# Patient Record
Sex: Male | Born: 2000 | Race: White | Hispanic: No | Marital: Single | Smoking: Never smoker
Health system: Southern US, Community
[De-identification: ages and names within clinical notes are randomized; demographics above are authoritative.]

---
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
## 2019-05-25 ENCOUNTER — Emergency Department (HOSPITAL_COMMUNITY)
Admission: EM | Admit: 2019-05-25 | Discharge: 2019-05-25 | Disposition: A | Discharge status: Home / Self Care | Payer: Self-pay | Attending: Emergency Medicine | Admitting: Emergency Medicine

## 2019-05-25 ENCOUNTER — Other Ambulatory Visit: Payer: Self-pay

## 2019-05-25 ENCOUNTER — Encounter (HOSPITAL_COMMUNITY): Payer: Self-pay

## 2019-05-25 DIAGNOSIS — L02213 Cutaneous abscess of chest wall: Secondary | ICD-10-CM | POA: Insufficient documentation

## 2019-05-25 DIAGNOSIS — L0291 Cutaneous abscess, unspecified: Secondary | ICD-10-CM

## 2019-05-25 MED ORDER — LIDOCAINE-EPINEPHRINE 2 %-1:100000 IJ SOLN: 10.0000 mL | Freq: Once | INTRAMUSCULAR | Status: DC

## 2019-05-25 MED ORDER — DOXYCYCLINE HYCLATE 100 MG PO CAPS: 100.0000 mg | ORAL_CAPSULE | Freq: Two times a day (BID) | ORAL | 0 refills | Status: DC

## 2019-05-25 MED ORDER — LIDOCAINE-EPINEPHRINE 2 %-1:100000 IJ SOLN: 20.0000 mL | Freq: Once | INTRAMUSCULAR | Status: DC

## 2019-05-25 NOTE — ED Triage Notes (Signed)
Patient reports that he has an absces? Or black head? On his mid chest x 4 days.

## 2019-05-25 NOTE — ED Provider Notes (Signed)
[facility] DEPT Provider Note   CSN: [id number] Arrival date & time: 05/25/19  1836    History   Chief Complaint Chief Complaint  Patient presents with  . Abscess    HPI Stephen Herring is a 18 y.o. male.     52 yo M with a chief complaints of a chest wall abscess.  The patient noticed a bump on his chest and then shaved his chest to try and see what it was.  Since then it has gotten slightly worse.  Going on for the past couple days.  No fevers chills no nausea.  Has had blemishes on his chest before.  The history is provided by the patient.  Abscess  Location:  Torso Torso abscess location: chest wall. Abscess quality: induration, itching and painful   Red streaking: no   Pain details:    Quality:  No pain   Severity:  Mild   Duration:  2 days   Timing:  Constant   Progression:  Worsening Chronicity:  Recurrent Context: not diabetes and not immunosuppression   Relieved by:  Nothing Worsened by:  Nothing Ineffective treatments:  None tried Associated symptoms: no fever, no headaches and no vomiting     History reviewed. No pertinent past medical history.  There are no active problems to display for this patient.   History reviewed. No pertinent surgical history.      Home Medications    Prior to Admission medications   Medication Sig Start Date End Date Taking? Authorizing Provider  doxycycline (VIBRAMYCIN) 100 MG capsule Take 1 capsule (100 mg total) by mouth 2 (two) times daily. One po bid x 7 days 05/25/19   Deno Etienne, DO    Family History History reviewed. No pertinent family history.  Social History Social History   Tobacco Use  . Smoking status: Never Smoker  . Smokeless tobacco: Never Used  Substance Use Topics  . Alcohol use: Never    Frequency: Never  . Drug use: Never     Allergies   Patient has no known allergies.   Review of Systems Review of Systems  Constitutional: Negative for chills and  fever.  HENT: Negative for congestion and facial swelling.   Eyes: Negative for discharge and visual disturbance.  Respiratory: Negative for shortness of breath.   Cardiovascular: Negative for chest pain and palpitations.  Gastrointestinal: Negative for abdominal pain, diarrhea and vomiting.  Musculoskeletal: Negative for arthralgias and myalgias.  Skin: Positive for color change and wound. Negative for rash.  Neurological: Negative for tremors, syncope and headaches.  Psychiatric/Behavioral: Negative for confusion and dysphoric mood.     Physical Exam Updated Vital Signs BP 126/70 (BP Location: Right Arm)   Pulse 87   Temp 99 F (37.2 C) (Oral)   Resp 16   Ht 6' (1.829 m)   Wt 65.8 kg   SpO2 100%   BMI 19.67 kg/m   Physical Exam Vitals signs and nursing note reviewed.  Constitutional:      Appearance: He is well-developed.  HENT:     Head: Normocephalic and atraumatic.  Eyes:     Pupils: Pupils are equal, round, and reactive to light.  Neck:     Musculoskeletal: Normal range of motion and neck supple.     Vascular: No JVD.  Cardiovascular:     Rate and Rhythm: Normal rate and regular rhythm.     Heart sounds: No murmur. No friction rub. No gallop.      Comments: Confluent  appearing abscess to the anterior chest wall over the sternum. Pulmonary:     Effort: No respiratory distress.     Breath sounds: No wheezing.  Abdominal:     General: There is no distension.     Tenderness: There is no guarding or rebound.  Musculoskeletal: Normal range of motion.  Skin:    Coloration: Skin is not pale.     Findings: No rash.  Neurological:     Mental Status: He is alert and oriented to person, place, and time.  Psychiatric:        Behavior: Behavior normal.      ED Treatments / Results  Labs (all labs ordered are listed, but only abnormal results are displayed) Labs Reviewed - No data to display  EKG None  Radiology No results found.  Procedures .Marland Kitchen.Incision  and Drainage Date/Time: 05/25/2019 7:43 PM Performed by: Melene PlanFloyd, Wilmont Olund, DO Authorized by: Melene PlanFloyd, Lacoya Wilbanks, DO   Consent:    Consent obtained:  Verbal   Consent given by:  Patient   Risks discussed:  Bleeding, incomplete drainage and infection   Alternatives discussed:  No treatment, delayed treatment and alternative treatment Location:    Type:  Abscess   Location:  Trunk   Trunk location:  Chest Pre-procedure details:    Skin preparation:  Chloraprep Anesthesia (see MAR for exact dosages):    Anesthesia method:  Local infiltration   Local anesthetic:  Lidocaine 2% WITH epi Procedure type:    Complexity:  Complex Procedure details:    Needle aspiration: no     Incision types:  Single straight   Incision depth:  Subcutaneous   Scalpel blade:  11   Wound management:  Probed and deloculated   Drainage:  Bloody   Drainage amount:  Moderate   Wound treatment:  Wound left open   Packing materials:  None Post-procedure details:    Patient tolerance of procedure:  Tolerated well, no immediate complications   (including critical care time)  Medications Ordered in ED Medications  lidocaine-EPINEPHrine (XYLOCAINE W/EPI) 2 %-1:100000 (with pres) injection 10 mL (has no administration in time range)     Initial Impression / Assessment and Plan / ED Course  I have reviewed the triage vital signs and the nursing notes.  Pertinent labs & imaging results that were available during my care of the patient were reviewed by me and considered in my medical decision making (see chart for details).        18 yo M with a chest wall abscess.  I&D at bedside.  Started on a course of doxycycline.  PCP follow-up.  7:43 PM:  I have discussed the diagnosis/risks/treatment options with the patient and believe the pt to be eligible for discharge home to follow-up with PCP. We also discussed returning to the ED immediately if new or worsening sx occur. We discussed the sx which are most concerning (e.g.,  sudden worsening pain, fever, inability to tolerate by mouth) that necessitate immediate return. Medications administered to the patient during their visit and any new prescriptions provided to the patient are listed below.  Medications given during this visit Medications  lidocaine-EPINEPHrine (XYLOCAINE W/EPI) 2 %-1:100000 (with pres) injection 10 mL (has no administration in time range)     The patient appears reasonably screen and/or stabilized for discharge and I doubt any other medical condition or other [facility] requiring further screening, evaluation, or treatment in the ED at this time prior to discharge.    Final Clinical Impressions(s) / ED Diagnoses  Final diagnoses:  Abscess    ED Discharge Orders         Ordered    doxycycline (VIBRAMYCIN) 100 MG capsule  2 times daily     05/25/19 1938           Melene PlanFloyd, Rey Fors, DO 05/25/19 1943

## 2019-05-25 NOTE — Discharge Instructions (Signed)
Warm compresses 4 times a day.  Return for rapid spreading redness fever.

## 2022-01-26 ENCOUNTER — Other Ambulatory Visit: Payer: Self-pay

## 2022-01-26 ENCOUNTER — Emergency Department (HOSPITAL_COMMUNITY): Payer: Self-pay

## 2022-01-26 ENCOUNTER — Encounter (HOSPITAL_COMMUNITY): Payer: Self-pay | Admitting: Emergency Medicine

## 2022-01-26 ENCOUNTER — Emergency Department (HOSPITAL_COMMUNITY)
Admission: EM | Admit: 2022-01-26 | Discharge: 2022-01-26 | Disposition: A | Discharge status: Home / Self Care | Payer: Self-pay | Attending: Emergency Medicine | Admitting: Emergency Medicine

## 2022-01-26 DIAGNOSIS — R002 Palpitations: Secondary | ICD-10-CM

## 2022-01-26 DIAGNOSIS — Y990 Civilian activity done for income or pay: Secondary | ICD-10-CM | POA: Insufficient documentation

## 2022-01-26 DIAGNOSIS — R000 Tachycardia, unspecified: Secondary | ICD-10-CM | POA: Insufficient documentation

## 2022-01-26 DIAGNOSIS — F1590 Other stimulant use, unspecified, uncomplicated: Secondary | ICD-10-CM | POA: Insufficient documentation

## 2022-01-26 DIAGNOSIS — F419 Anxiety disorder, unspecified: Secondary | ICD-10-CM | POA: Insufficient documentation

## 2022-01-26 DIAGNOSIS — Z79899 Other long term (current) drug therapy: Secondary | ICD-10-CM | POA: Insufficient documentation

## 2022-01-26 LAB — HEPATIC FUNCTION PANEL
ALT: 19 U/L (ref 0–44)
AST: 21 U/L (ref 15–41)
Albumin: 4.3 g/dL (ref 3.5–5.0)
Alkaline Phosphatase: 68 U/L (ref 38–126)
Bilirubin, Direct: 0.1 mg/dL (ref 0.0–0.2)
Indirect Bilirubin: 0.5 mg/dL (ref 0.3–0.9)
Total Bilirubin: 0.6 mg/dL (ref 0.3–1.2)
Total Protein: 7.6 g/dL (ref 6.5–8.1)

## 2022-01-26 LAB — URINALYSIS, ROUTINE W REFLEX MICROSCOPIC
Bilirubin Urine: NEGATIVE
Glucose, UA: NEGATIVE mg/dL
Hgb urine dipstick: NEGATIVE
Ketones, ur: NEGATIVE mg/dL
Leukocytes,Ua: NEGATIVE
Nitrite: NEGATIVE
Protein, ur: NEGATIVE mg/dL
Specific Gravity, Urine: 1.026 (ref 1.005–1.030)
pH: 6 (ref 5.0–8.0)

## 2022-01-26 LAB — BASIC METABOLIC PANEL
Anion gap: 7 (ref 5–15)
BUN: 26 mg/dL — ABNORMAL HIGH (ref 6–20)
CO2: 31 mmol/L (ref 22–32)
Calcium: 8.8 mg/dL — ABNORMAL LOW (ref 8.9–10.3)
Chloride: 101 mmol/L (ref 98–111)
Creatinine, Ser: 0.79 mg/dL (ref 0.61–1.24)
GFR, Estimated: >60 mL/min (ref >60)
Glucose, Bld: 91 mg/dL (ref 70–99)
Potassium: 3.3 mmol/L — ABNORMAL LOW (ref 3.5–5.1)
Sodium: 139 mmol/L (ref 135–145)

## 2022-01-26 LAB — CBC
HCT: 48.5 % (ref 39.0–52.0)
Hemoglobin: 17.7 g/dL — ABNORMAL HIGH (ref 13.0–17.0)
MCH: 31.3 pg (ref 26.0–34.0)
MCHC: 36.5 g/dL — ABNORMAL HIGH (ref 30.0–36.0)
MCV: 85.7 fL (ref 80.0–100.0)
Platelets: 198 10*3/uL (ref 150–400)
RBC: 5.66 MIL/uL (ref 4.22–5.81)
RDW: 12.4 % (ref 11.5–15.5)
WBC: 8.7 10*3/uL (ref 4.0–10.5)
nRBC: 0 % (ref 0.0–0.2)

## 2022-01-26 LAB — RAPID URINE DRUG SCREEN, HOSP PERFORMED
Amphetamines: POSITIVE — AB
Barbiturates: NOT DETECTED
Benzodiazepines: NOT DETECTED
Cocaine: NOT DETECTED
Opiates: NOT DETECTED
Tetrahydrocannabinol: NOT DETECTED

## 2022-01-26 LAB — MAGNESIUM: Magnesium: 2.5 mg/dL — ABNORMAL HIGH (ref 1.7–2.4)

## 2022-01-26 LAB — TSH: TSH: 1.73 u[IU]/mL (ref 0.350–4.500)

## 2022-01-26 LAB — ETHANOL: Alcohol, Ethyl (B): <10 mg/dL (ref <10)

## 2022-01-26 MED ORDER — SODIUM CHLORIDE 0.9 % IV BOLUS
1000.0000 mL | Freq: Once | INTRAVENOUS | Status: AC
  Administered 2022-01-26: 1000 mL via INTRAVENOUS

## 2022-01-26 NOTE — ED Triage Notes (Signed)
Patient c/o anxiety worsening for months. States he is going to get evicted from his house, lost his wallet, and used drugs x1 week ago.

## 2022-01-26 NOTE — ED Provider Notes (Signed)
[facility]-EMERGENCY DEPT Provider Note   CSN: [id number] Arrival date & time: 01/26/22  0702     History  Chief Complaint  Patient presents with   Anxiety    Stephen Herring is a 21 y.o. male.  Pt is a 21 yo wm with no past medical hx.  Pt said he feels like he has anxiety.  The anxiety has been worsening for a few months.  He has been evicted from his house.  He can't sleep.  He wants to see a therapist, but does not know how to get one.  He feels like his heart is going too fast and then he can't breathe.  He said last night was the worst, so he came in this am.  He feels depressed and has had some brief suicidal thoughts, but has people whom he cares for and who cares about him, so he does not seriously think of suicide.        Home Medications Prior to Admission medications   Medication Sig Start Date End Date Taking? Authorizing Provider  doxycycline (VIBRAMYCIN) 100 MG capsule Take 1 capsule (100 mg total) by mouth 2 (two) times daily. One po bid x 7 days 05/25/19   Melene Plan, DO      Allergies    Patient has no known allergies.    Review of Systems   Review of Systems  Cardiovascular:  Positive for palpitations.  Psychiatric/Behavioral:  Positive for dysphoric mood, sleep disturbance and suicidal ideas. The patient is nervous/anxious.   All other systems reviewed and are negative.  Physical Exam Updated Vital Signs BP 131/73    Pulse 100    Temp 98.4 F (36.9 C) (Oral)    Resp 18    SpO2 100%  Physical Exam Vitals and nursing note reviewed.  Constitutional:      Appearance: Normal appearance.  HENT:     Head: Normocephalic.     Right Ear: External ear normal.     Left Ear: External ear normal.     Nose: Nose normal.     Mouth/Throat:     Mouth: Mucous membranes are dry.  Eyes:     Extraocular Movements: Extraocular movements intact.     Conjunctiva/sclera: Conjunctivae normal.     Pupils: Pupils are equal, round, and reactive to  light.  Cardiovascular:     Rate and Rhythm: Regular rhythm. Tachycardia present.     Pulses: Normal pulses.     Heart sounds: Normal heart sounds.  Pulmonary:     Effort: Pulmonary effort is normal.     Breath sounds: Normal breath sounds.  Abdominal:     General: Abdomen is flat. Bowel sounds are normal.     Palpations: Abdomen is soft.  Musculoskeletal:        General: Normal range of motion.     Cervical back: Normal range of motion and neck supple.  Skin:    General: Skin is warm.     Capillary Refill: Capillary refill takes less than 2 seconds.  Neurological:     General: No focal deficit present.     Mental Status: He is alert and oriented to person, place, and time.  Psychiatric:        Mood and Affect: Mood normal.        Behavior: Behavior normal.        Thought Content: Thought content normal.        Judgment: Judgment normal.    ED Results / Procedures /  Treatments   Labs (all labs ordered are listed, but only abnormal results are displayed) Labs Reviewed  BASIC METABOLIC PANEL - Abnormal; Notable for the following components:      Result Value   Potassium 3.3 (*)    BUN 26 (*)    Calcium 8.8 (*)    All other components within normal limits  MAGNESIUM - Abnormal; Notable for the following components:   Magnesium 2.5 (*)    All other components within normal limits  CBC - Abnormal; Notable for the following components:   Hemoglobin 17.7 (*)    MCHC 36.5 (*)    All other components within normal limits  RAPID URINE DRUG SCREEN, HOSP PERFORMED - Abnormal; Notable for the following components:   Amphetamines POSITIVE (*)    All other components within normal limits  TSH  HEPATIC FUNCTION PANEL  URINALYSIS, ROUTINE W REFLEX MICROSCOPIC  ETHANOL    EKG EKG Interpretation  Date/Time:  Sunday January 26 2022 07:51:46 EST Ventricular Rate:  71 PR Interval:  169 QRS Duration: 92 QT Interval:  388 QTC Calculation: 422 R Axis:   90 Text  Interpretation: Sinus rhythm Borderline right axis deviation Nonspecific T abnrm, anterolateral leads Confirmed by Jacalyn Lefevre [phone]) on 01/26/2022 8:05:11 AM  Radiology DG Chest Port 1 View  Result Date: 01/26/2022 CLINICAL DATA:  21 year old male with history of palpitations. EXAM: PORTABLE CHEST 1 VIEW COMPARISON:  No priors. FINDINGS: Lung volumes are normal. No consolidative airspace disease. No pleural effusions. No pneumothorax. No pulmonary nodule or mass noted. Pulmonary vasculature and the cardiomediastinal silhouette are within normal limits. IMPRESSION: No radiographic evidence of acute cardiopulmonary disease. Electronically Signed   By: Trudie Reed M.D.   On: 01/26/2022 08:13    Procedures Procedures    Medications Ordered in ED Medications  sodium chloride 0.9 % bolus 1,000 mL (1,000 mLs Intravenous New Bag/Given 01/26/22 0750)    ED Course/ Medical Decision Making/ A&P                           Medical Decision Making Amount and/or Complexity of Data Reviewed Labs: ordered. Radiology: ordered.   This patient presents to the ED for concern of palpitations and anxiety, this involves an extensive number of treatment options, and is a complaint that carries with it a high risk of complications and morbidity.  The differential diagnosis includes electrolyte disturbance, drug use, ekg abnormality   Co morbidities that complicate the patient evaluation  none   Additional history obtained:  Additional history obtained from epic chart review   Lab Tests:  I Ordered, and personally interpreted labs.  The pertinent results include:  cbc nl, CMP nl, mg nl, UA nl, UDS + amphetamines   Imaging Studies ordered:  I ordered imaging studies including cxr  I independently visualized and interpreted imaging which showed nothing acute I agree with the radiologist interpretation   Cardiac Monitoring:  The patient was maintained on a cardiac monitor.  I personally  viewed and interpreted the cardiac monitored which showed an underlying rhythm of: nsr   Medicines ordered and prescription drug management:  I ordered medication including IVFs  for dehydration  Reevaluation of the patient after these medicines showed that the patient improved I have reviewed the patients home medicines and have made adjustments as needed  Critical Interventions:  IVFs   Problem List / ED Course:  Anxiety:  Pt is not actively suicidal or homicidal.  He does  not meet inpatient criteria and does not want to stay for a TTS eval.  He is given the number to [facility] and a resource guide for outpatient treatment. Palpitations:  Likely due to anxiety and amphetamine use.  He admits to using some meth.  He is encouraged to not use meth as it makes all of his sx worse.   Reevaluation:  After the interventions noted above, I reevaluated the patient and found that they have :improved   Social Determinants of Health:  Pt said he has good social support.   Dispostion:  After consideration of the diagnostic results and the patients response to treatment, I feel that the patent would benefit from discharge with outpatient f/u.  Pt knows to return if worse.  Final Clinical Impression(s) / ED Diagnoses Final diagnoses:  Anxiety  Palpitations  Amphetamine use    Rx / DC Orders ED Discharge Orders     None         Jacalyn Lefevre, MD 01/26/22 [phone]

## 2022-01-26 NOTE — Discharge Instructions (Addendum)
Do not use amphetamines or any drugs that are not prescribed to you.

## 2023-02-18 IMAGING — DX DG CHEST 1V PORT
2 series · 2 of 2 positions shown · non-contrast
Comparison: No priors.

CLINICAL DATA: 20-year-old male with history of palpitations.

EXAM:
PORTABLE CHEST 1 VIEW

[chest ap (1 of 2)]
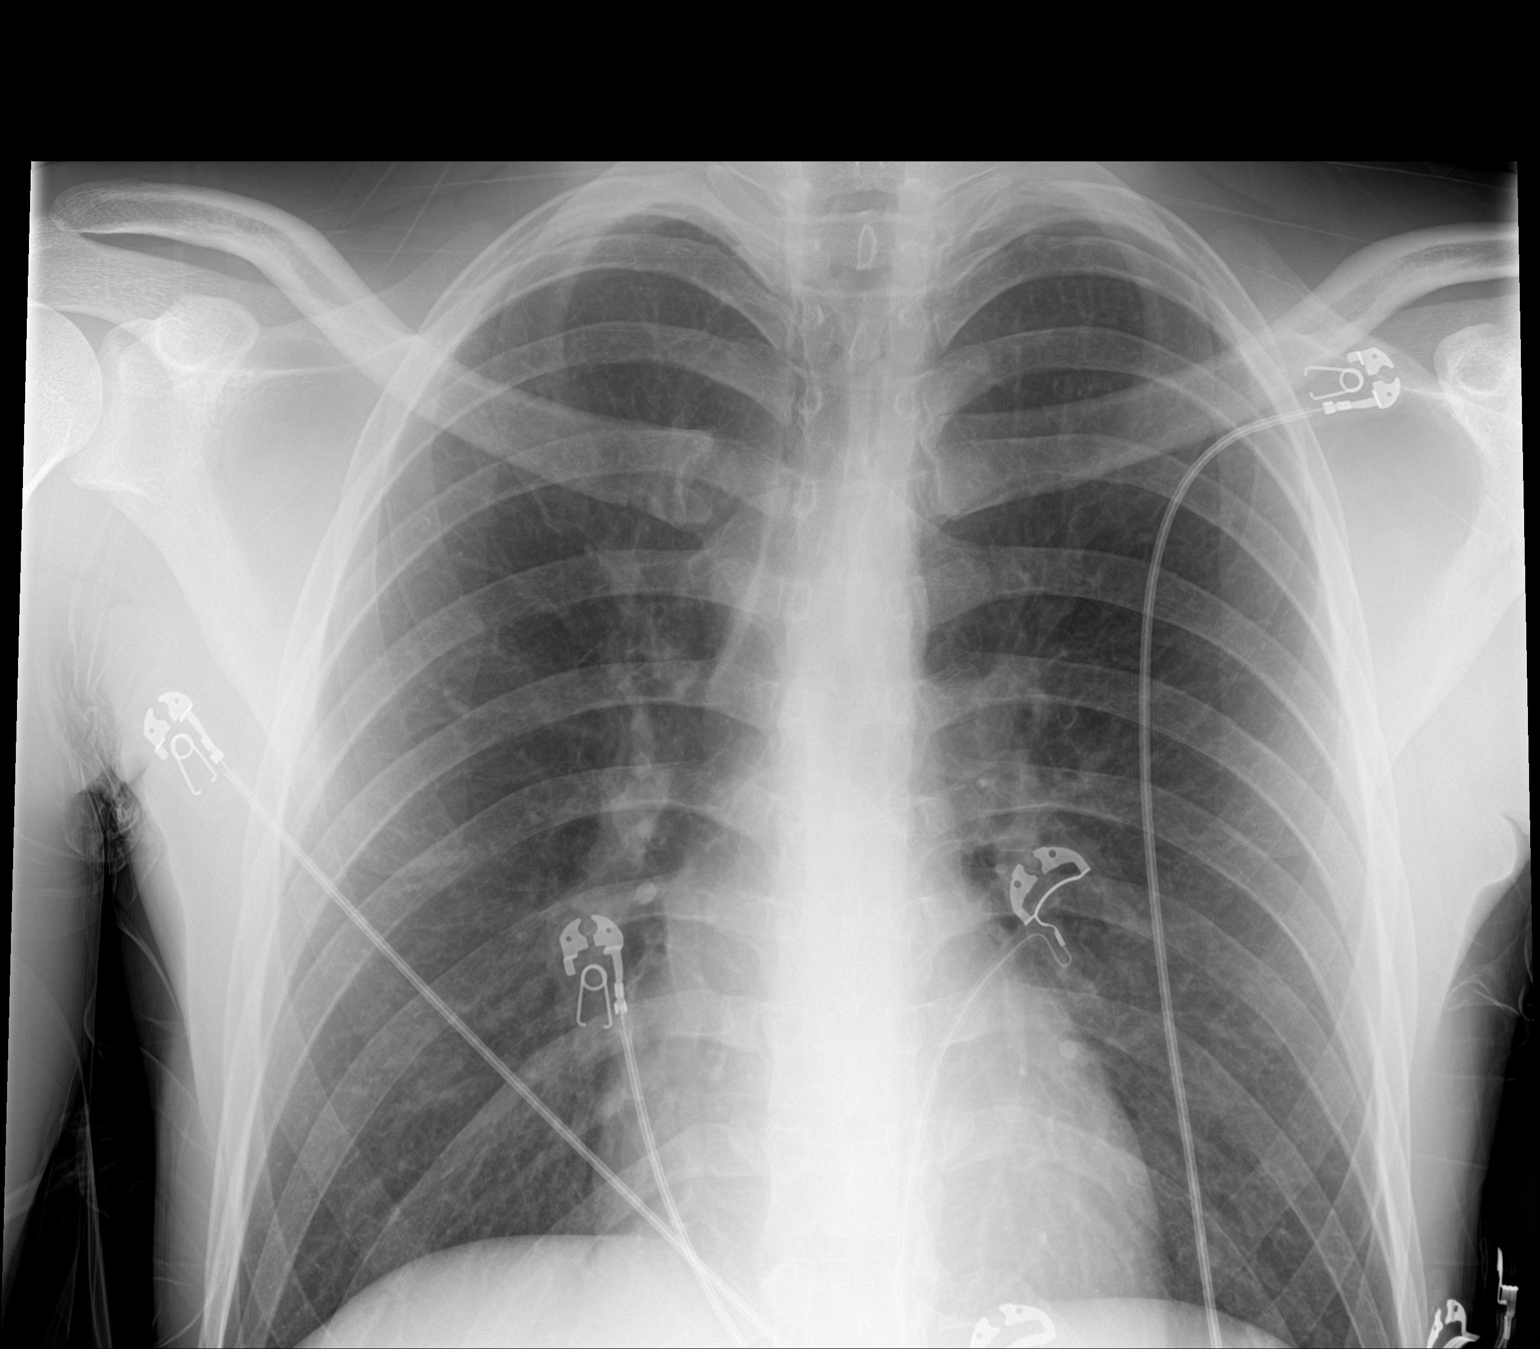

[chest ap (2 of 2)]
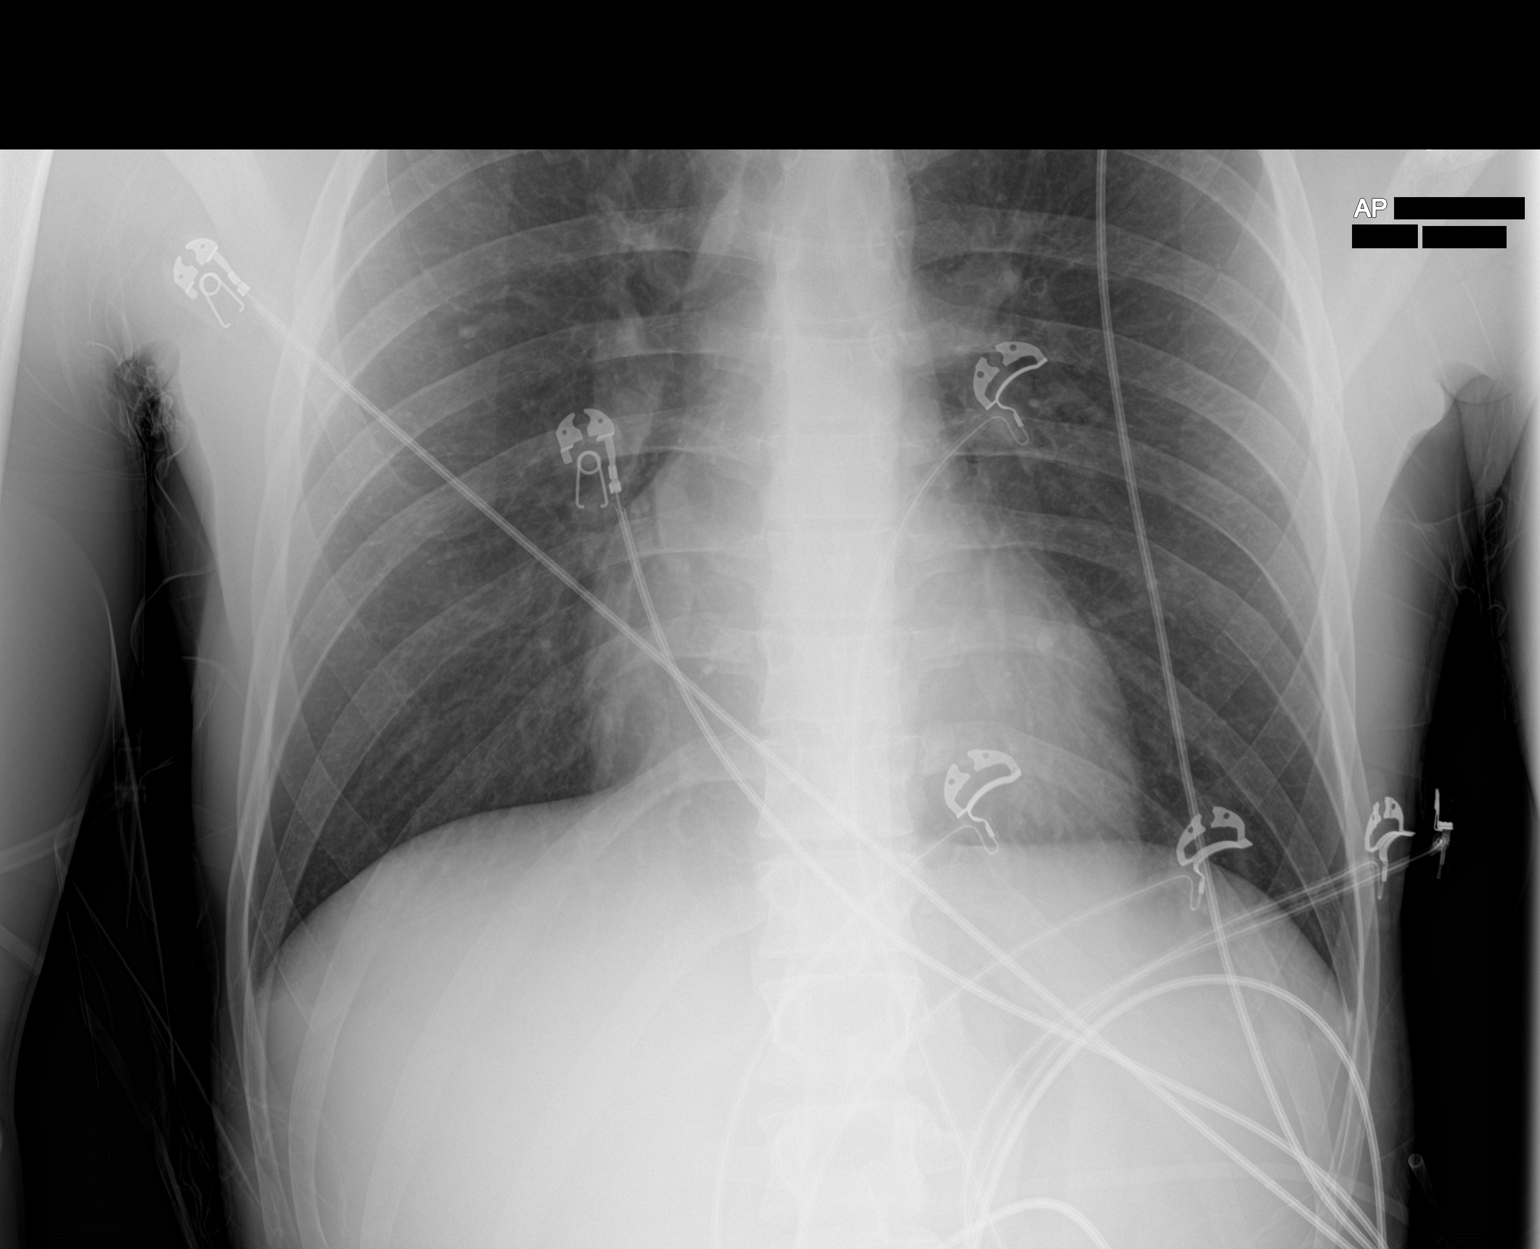

[2 of 2 positions shown; findings below may reference images not displayed]

FINDINGS: Lung volumes are normal. No consolidative airspace disease. No
pleural effusions. No pneumothorax. No pulmonary nodule or mass
noted. Pulmonary vasculature and the cardiomediastinal silhouette
are within normal limits.
IMPRESSION: No radiographic evidence of acute cardiopulmonary disease.

## 2024-10-13 ENCOUNTER — Emergency Department (HOSPITAL_BASED_OUTPATIENT_CLINIC_OR_DEPARTMENT_OTHER)
Admission: EM | Admit: 2024-10-13 | Discharge: 2024-10-14 | Disposition: A | Discharge status: Home / Self Care | Attending: Emergency Medicine | Admitting: Emergency Medicine

## 2024-10-13 ENCOUNTER — Encounter (HOSPITAL_BASED_OUTPATIENT_CLINIC_OR_DEPARTMENT_OTHER): Payer: Self-pay | Admitting: Emergency Medicine

## 2024-10-13 DIAGNOSIS — A630 Anogenital (venereal) warts: Secondary | ICD-10-CM | POA: Insufficient documentation

## 2024-10-13 DIAGNOSIS — K6289 Other specified diseases of anus and rectum: Secondary | ICD-10-CM | POA: Diagnosis present

## 2024-10-13 LAB — CBC WITH DIFFERENTIAL/PLATELET
Abs Immature Granulocytes: 0.03 K/uL (ref 0.00–0.07)
Basophils Absolute: 0.1 K/uL (ref 0.0–0.1)
Basophils Relative: 1 %
Eosinophils Absolute: 0.1 K/uL (ref 0.0–0.5)
Eosinophils Relative: 2 %
HCT: 45.5 % (ref 39.0–52.0)
Hemoglobin: 16 g/dL (ref 13.0–17.0)
Immature Granulocytes: 0 %
Lymphocytes Relative: 22 %
Lymphs Abs: 1.9 K/uL (ref 0.7–4.0)
MCH: 29.1 pg (ref 26.0–34.0)
MCHC: 35.2 g/dL (ref 30.0–36.0)
MCV: 82.9 fL (ref 80.0–100.0)
Monocytes Absolute: 0.6 K/uL (ref 0.1–1.0)
Monocytes Relative: 7 %
Neutro Abs: 5.9 K/uL (ref 1.7–7.7)
Neutrophils Relative %: 68 %
Platelets: 262 K/uL (ref 150–400)
RBC: 5.49 MIL/uL (ref 4.22–5.81)
RDW: 12.8 % (ref 11.5–15.5)
WBC: 8.6 K/uL (ref 4.0–10.5)
nRBC: 0 % (ref 0.0–0.2)

## 2024-10-13 LAB — CBG MONITORING, ED: Glucose-Capillary: 82 mg/dL (ref 70–99)

## 2024-10-13 MED ORDER — IMIQUIMOD 5 % EX CREA: TOPICAL_CREAM | CUTANEOUS | 0 refills | Status: AC

## 2024-10-13 NOTE — ED Provider Notes (Signed)
 [place] EMERGENCY DEPARTMENT AT [facility] Provider Note   CSN: [id number] Arrival date & time: 10/13/24  2237     Patient presents with: Rectal Pain   Stephen Herring is a 23 y.o. male.  {Add pertinent medical, surgical, social history, OB history to YEP:67052} The history is provided by the patient.  Patient presents for rash. He reports for the past week he has noticed a painful rash to his rectum.  No fevers or vomiting. He reports having anal intercourse over a week ago and having symptoms since that time.  He is also reporting dysuria.  He also reports mild numbness to the bottom of both of his feet of unclear etiology.  He reports increased fatigue.  No fevers or vomiting.  No abdominal pain. Denies rash to any other area of his body    Prior to Admission medications   Not on File    Allergies: Patient has no known allergies.    Review of Systems  Constitutional:  Positive for fatigue. Negative for fever.  Gastrointestinal:  Positive for rectal pain. Negative for vomiting.  Genitourinary:  Positive for dysuria. Negative for penile discharge.    Updated Vital Signs BP (!) 155/101   Pulse (!) 115   Temp 99 F (37.2 C)   Resp 18   SpO2 100%   Physical Exam CONSTITUTIONAL: He is anxious, disheveled HEAD: Normocephalic/atraumatic CV: S1/S2 noted, no murmurs/rubs/gallops noted LUNGS: Lungs are clear to auscultation bilaterally, no apparent distress ABDOMEN: soft, nontender, no rebound or guarding, bowel sounds noted throughout abdomen GU: GU exam chaperoned with nurse Maryjane Testicles descended bilaterally without any overlying tenderness or erythema.  No hernia.  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  No abscess, no blood or NEURO: Pt is awake/alert/appropriate, moves all extremitiesx4.  No facial droop.  No sensory deficit to his lower extremity Full range of motion both lower extremities without  difficulty EXTREMITIES: pulses normal/equal in both feet, full ROM, no deformities SKIN: warm, color normal PSYCH: Anxious  (all labs ordered are listed, but only abnormal results are displayed) Labs Reviewed  HIV ANTIBODY (ROUTINE TESTING W REFLEX)  RPR  URINALYSIS, ROUTINE W REFLEX MICROSCOPIC  CBC WITH DIFFERENTIAL/PLATELET  BASIC METABOLIC PANEL WITH GFR  CBG MONITORING, ED  GC/CHLAMYDIA PROBE AMP ([place]) NOT AT [facility]  GC/CHLAMYDIA PROBE AMP ([place]) NOT AT [facility]    EKG: None  Radiology: No results found.  {Document cardiac monitor, telemetry assessment procedure when appropriate:32947} Procedures   Medications Ordered in the ED - No data to display  Clinical Course as of 10/13/24 2346  Thu Oct 13, 2024  2345 Patient presents for multiple complaints including rectal pain and numbness to his feet  For the numbness in his feet, there is no concerning findings on exam.  No focal neurodeficits was noted.  Will perform screening lab   For his rectal pain, patient appears to have anal warts.  Will screen for STDs. [DW]    Clinical Course User Index [DW] Midge Golas, MD   {Click here for ABCD2, HEART and other calculators REFRESH Note before signing:1}                              Medical Decision Making Amount and/or Complexity of Data Reviewed Labs: ordered.   ***  {Document critical care time when appropriate  Document review of labs and clinical decision tools ie CHADS2VASC2,  etc  Document your independent review of radiology images and any outside records  Document your discussion with family members, caretakers and with consultants  Document social determinants of health affecting pt's care  Document your decision making why or why not admission, treatments were needed:32947:::1}   Final diagnoses:  None    ED Discharge Orders     None

## 2024-10-13 NOTE — Discharge Instructions (Addendum)
 You can use the topical cream 3 times a week.  You can use this for the next 1-2 months.  Please follow-up with the infectious disease doctor listed in 2 weeks

## 2024-10-13 NOTE — ED Triage Notes (Signed)
 Rectal pain,  Started as a rash, now pain x 1 weeks  Also reports some problem with foot  Both of them sometime, but mostly the right one

## 2024-10-14 LAB — URINALYSIS, ROUTINE W REFLEX MICROSCOPIC
Bacteria, UA: NONE SEEN
Bilirubin Urine: NEGATIVE
Glucose, UA: NEGATIVE mg/dL
Hgb urine dipstick: NEGATIVE
Ketones, ur: NEGATIVE mg/dL
Leukocytes,Ua: NEGATIVE
Nitrite: NEGATIVE
Specific Gravity, Urine: 1.028 (ref 1.005–1.030)
pH: 6.5 (ref 5.0–8.0)

## 2024-10-14 LAB — BASIC METABOLIC PANEL WITH GFR
Anion gap: 9 (ref 5–15)
BUN: 24 mg/dL — ABNORMAL HIGH (ref 6–20)
CO2: 29 mmol/L (ref 22–32)
Calcium: 9.9 mg/dL (ref 8.9–10.3)
Chloride: 102 mmol/L (ref 98–111)
Creatinine, Ser: 0.89 mg/dL (ref 0.61–1.24)
GFR, Estimated: >60 mL/min (ref >60)
Glucose, Bld: 80 mg/dL (ref 70–99)
Potassium: 3.9 mmol/L (ref 3.5–5.1)
Sodium: 140 mmol/L (ref 135–145)

## 2024-10-14 LAB — RPR
RPR Ser Ql: REACTIVE — AB
RPR Titer: >=1:256 {titer}

## 2024-10-14 LAB — HIV ANTIBODY (ROUTINE TESTING W REFLEX): HIV Screen 4th Generation wRfx: NONREACTIVE

## 2024-10-17 LAB — GC/CHLAMYDIA PROBE AMP (~~LOC~~) NOT AT ARMC
Chlamydia: NEGATIVE
Chlamydia: NEGATIVE
Comment: NEGATIVE
Comment: NEGATIVE
Comment: NORMAL
Comment: NORMAL
Neisseria Gonorrhea: NEGATIVE
Neisseria Gonorrhea: NEGATIVE

## 2024-10-17 LAB — T.PALLIDUM AB, TOTAL: T Pallidum Abs: REACTIVE — AB

## 2024-10-18 ENCOUNTER — Ambulatory Visit (HOSPITAL_COMMUNITY): Payer: Self-pay
